# Patient Record
Sex: Male | Born: 1985 | Race: White | Hispanic: No | Marital: Single | Smoking: Current every day smoker
Health system: Southern US, Community
[De-identification: ages and names within clinical notes are randomized; demographics above are authoritative.]

---
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
## 2006-08-15 ENCOUNTER — Encounter: Admission: RE | Admit: 2006-08-15 | Discharge: 2006-08-15 | Payer: Self-pay | Admitting: Family Medicine

## 2007-05-21 ENCOUNTER — Encounter: Admission: RE | Admit: 2007-05-21 | Discharge: 2007-05-21 | Payer: Self-pay | Admitting: Family Medicine

## 2008-02-02 ENCOUNTER — Emergency Department: Payer: Self-pay | Admitting: Emergency Medicine

## 2008-06-27 ENCOUNTER — Encounter: Admission: RE | Admit: 2008-06-27 | Discharge: 2008-06-27 | Payer: Self-pay | Admitting: Family Medicine

## 2011-02-13 ENCOUNTER — Emergency Department (HOSPITAL_COMMUNITY)
Admission: EM | Admit: 2011-02-13 | Discharge: 2011-02-13 | Disposition: A | Discharge status: Home / Self Care | Payer: BC Managed Care – PPO | Source: Home / Self Care | Attending: Emergency Medicine | Admitting: Emergency Medicine

## 2011-02-13 DIAGNOSIS — Z5321 Procedure and treatment not carried out due to patient leaving prior to being seen by health care provider: Secondary | ICD-10-CM

## 2011-02-13 NOTE — ED Provider Notes (Signed)
History     CSN: [id number]  Arrival date & time 02/13/11  1246   First MD Initiated Contact with Patient 02/13/11 1303      No chief complaint on file.   (Consider location/radiation/quality/duration/timing/severity/associated sxs/prior treatment) HPI  No past medical history on file.  No past surgical history on file.  No family history on file.  History  Substance Use Topics  . Smoking status: Not on file  . Smokeless tobacco: Not on file  . Alcohol Use: Not on file      Review of Systems  Allergies  Review of patient's allergies indicates not on file.  Home Medications  No current outpatient prescriptions on file.  There were no vitals taken for this visit.  Physical Exam  ED Course  Procedures (including critical care time)  Labs Reviewed - No data to display No results found.   No diagnosis found.    MDM  The patient left the department before being triaged. I did not participate in the care of this patient.   Luiz Blare, MD 02/13/11 2134

## 2013-08-19 ENCOUNTER — Other Ambulatory Visit: Payer: Self-pay | Admitting: Family Medicine

## 2013-08-19 ENCOUNTER — Ambulatory Visit
Admission: RE | Admit: 2013-08-19 | Discharge: 2013-08-19 | Disposition: A | Discharge status: Home / Self Care | Payer: No Typology Code available for payment source | Source: Ambulatory Visit | Attending: Family Medicine | Admitting: Family Medicine

## 2013-08-19 DIAGNOSIS — G8929 Other chronic pain: Secondary | ICD-10-CM

## 2013-08-19 DIAGNOSIS — M545 Low back pain: Principal | ICD-10-CM

## 2016-07-18 ENCOUNTER — Ambulatory Visit: Payer: BLUE CROSS/BLUE SHIELD | Attending: Otolaryngology

## 2016-07-18 DIAGNOSIS — R0683 Snoring: Secondary | ICD-10-CM | POA: Insufficient documentation

## 2016-07-18 DIAGNOSIS — F5101 Primary insomnia: Secondary | ICD-10-CM | POA: Insufficient documentation

## 2016-07-18 DIAGNOSIS — G4733 Obstructive sleep apnea (adult) (pediatric): Secondary | ICD-10-CM | POA: Insufficient documentation

## 2016-08-08 ENCOUNTER — Ambulatory Visit: Payer: BLUE CROSS/BLUE SHIELD | Attending: Neurology

## 2016-08-08 DIAGNOSIS — G4733 Obstructive sleep apnea (adult) (pediatric): Secondary | ICD-10-CM | POA: Insufficient documentation

## 2016-08-08 DIAGNOSIS — F5101 Primary insomnia: Secondary | ICD-10-CM | POA: Insufficient documentation

## 2017-08-16 ENCOUNTER — Ambulatory Visit
Admission: RE | Admit: 2017-08-16 | Discharge: 2017-08-16 | Disposition: A | Discharge status: Home / Self Care | Payer: BLUE CROSS/BLUE SHIELD | Source: Ambulatory Visit | Attending: Physician Assistant | Admitting: Physician Assistant

## 2017-08-16 ENCOUNTER — Other Ambulatory Visit: Payer: Self-pay | Admitting: Physician Assistant

## 2017-08-16 DIAGNOSIS — M25531 Pain in right wrist: Secondary | ICD-10-CM | POA: Diagnosis not present

## 2017-08-16 DIAGNOSIS — M795 Residual foreign body in soft tissue: Secondary | ICD-10-CM | POA: Insufficient documentation

## 2018-09-25 DIAGNOSIS — F17200 Nicotine dependence, unspecified, uncomplicated: Secondary | ICD-10-CM | POA: Diagnosis not present

## 2018-09-25 DIAGNOSIS — H00016 Hordeolum externum left eye, unspecified eyelid: Secondary | ICD-10-CM | POA: Diagnosis not present

## 2018-11-14 ENCOUNTER — Emergency Department (HOSPITAL_COMMUNITY)
Admission: EM | Admit: 2018-11-14 | Discharge: 2018-11-14 | Disposition: A | Discharge status: Home / Self Care | Payer: BC Managed Care – PPO | Attending: Emergency Medicine | Admitting: Emergency Medicine

## 2018-11-14 ENCOUNTER — Emergency Department (HOSPITAL_COMMUNITY): Payer: BC Managed Care – PPO

## 2018-11-14 ENCOUNTER — Other Ambulatory Visit: Payer: Self-pay

## 2018-11-14 ENCOUNTER — Encounter (HOSPITAL_COMMUNITY): Payer: Self-pay | Admitting: Emergency Medicine

## 2018-11-14 DIAGNOSIS — Y92481 Parking lot as the place of occurrence of the external cause: Secondary | ICD-10-CM | POA: Insufficient documentation

## 2018-11-14 DIAGNOSIS — F17210 Nicotine dependence, cigarettes, uncomplicated: Secondary | ICD-10-CM | POA: Insufficient documentation

## 2018-11-14 DIAGNOSIS — Y93I9 Activity, other involving external motion: Secondary | ICD-10-CM | POA: Diagnosis not present

## 2018-11-14 DIAGNOSIS — Y999 Unspecified external cause status: Secondary | ICD-10-CM | POA: Diagnosis not present

## 2018-11-14 DIAGNOSIS — R52 Pain, unspecified: Secondary | ICD-10-CM | POA: Diagnosis not present

## 2018-11-14 DIAGNOSIS — I10 Essential (primary) hypertension: Secondary | ICD-10-CM | POA: Diagnosis not present

## 2018-11-14 DIAGNOSIS — M542 Cervicalgia: Secondary | ICD-10-CM | POA: Diagnosis not present

## 2018-11-14 DIAGNOSIS — R519 Headache, unspecified: Secondary | ICD-10-CM | POA: Insufficient documentation

## 2018-11-14 DIAGNOSIS — S4992XA Unspecified injury of left shoulder and upper arm, initial encounter: Secondary | ICD-10-CM | POA: Diagnosis not present

## 2018-11-14 DIAGNOSIS — S0990XA Unspecified injury of head, initial encounter: Secondary | ICD-10-CM | POA: Diagnosis not present

## 2018-11-14 DIAGNOSIS — S199XXA Unspecified injury of neck, initial encounter: Secondary | ICD-10-CM | POA: Diagnosis not present

## 2018-11-14 DIAGNOSIS — M25512 Pain in left shoulder: Secondary | ICD-10-CM | POA: Diagnosis not present

## 2018-11-14 MED ORDER — METHOCARBAMOL 500 MG PO TABS: 500.0000 mg | ORAL_TABLET | Freq: Two times a day (BID) | ORAL | 0 refills | Status: AC

## 2018-11-14 NOTE — ED Notes (Signed)
Patient transported to X-ray 
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
## 2018-11-14 NOTE — ED Notes (Signed)
Pt returned from X-ray.  
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
## 2018-11-14 NOTE — ED Notes (Signed)
Patient transported to CT 
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
## 2018-11-20 DIAGNOSIS — M542 Cervicalgia: Secondary | ICD-10-CM | POA: Diagnosis not present

## 2018-11-20 DIAGNOSIS — M25512 Pain in left shoulder: Secondary | ICD-10-CM | POA: Diagnosis not present

## 2018-12-03 DIAGNOSIS — M25512 Pain in left shoulder: Secondary | ICD-10-CM | POA: Diagnosis not present

## 2018-12-10 DIAGNOSIS — M25512 Pain in left shoulder: Secondary | ICD-10-CM | POA: Diagnosis not present

## 2018-12-21 DIAGNOSIS — S43432A Superior glenoid labrum lesion of left shoulder, initial encounter: Secondary | ICD-10-CM | POA: Diagnosis not present

## 2019-01-29 DIAGNOSIS — X58XXXA Exposure to other specified factors, initial encounter: Secondary | ICD-10-CM | POA: Diagnosis not present

## 2019-01-29 DIAGNOSIS — Y999 Unspecified external cause status: Secondary | ICD-10-CM | POA: Diagnosis not present

## 2019-01-29 DIAGNOSIS — S43432A Superior glenoid labrum lesion of left shoulder, initial encounter: Secondary | ICD-10-CM | POA: Diagnosis not present

## 2019-01-29 DIAGNOSIS — G8918 Other acute postprocedural pain: Secondary | ICD-10-CM | POA: Diagnosis not present

## 2019-01-29 DIAGNOSIS — S43492A Other sprain of left shoulder joint, initial encounter: Secondary | ICD-10-CM | POA: Diagnosis not present
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
## 2020-04-14 DIAGNOSIS — K580 Irritable bowel syndrome with diarrhea: Secondary | ICD-10-CM | POA: Diagnosis not present

## 2020-06-19 DIAGNOSIS — U071 COVID-19: Secondary | ICD-10-CM | POA: Diagnosis not present

## 2020-06-19 DIAGNOSIS — R509 Fever, unspecified: Secondary | ICD-10-CM | POA: Diagnosis not present

## 2020-06-19 DIAGNOSIS — M549 Dorsalgia, unspecified: Secondary | ICD-10-CM | POA: Diagnosis not present

## 2022-12-02 ENCOUNTER — Other Ambulatory Visit: Payer: Self-pay

## 2022-12-02 ENCOUNTER — Emergency Department: Payer: Self-pay

## 2022-12-02 ENCOUNTER — Emergency Department
Admission: EM | Admit: 2022-12-02 | Discharge: 2022-12-02 | Disposition: A | Discharge status: Home / Self Care | Payer: Self-pay | Attending: Emergency Medicine | Admitting: Emergency Medicine

## 2022-12-02 DIAGNOSIS — S61211A Laceration without foreign body of left index finger without damage to nail, initial encounter: Secondary | ICD-10-CM | POA: Insufficient documentation

## 2022-12-02 DIAGNOSIS — W228XXA Striking against or struck by other objects, initial encounter: Secondary | ICD-10-CM | POA: Insufficient documentation

## 2022-12-02 MED ORDER — LIDOCAINE HCL (PF) 1 % IJ SOLN
5.0000 mL | Freq: Once | INTRAMUSCULAR | Status: AC
  Administered 2022-12-02: 5 mL via INTRADERMAL
  Filled 2022-12-02: qty 5
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
## 2022-12-02 NOTE — Discharge Instructions (Addendum)
Your stitches will need to be removed in 7 to 10 days.  This can be done by the emergency department, urgent care or your primary care provider. You can take 650 mg of Tylenol and 600 mg of ibuprofen every 6 hours as needed for pain.  Watch for signs of infection including redness, warmth, swelling, pain and pus drainage.  If you develop any of these please return to the ED, urgent care or your primary care provider.  Please leave the dressing intact for 24 hours, after this you can remove it and wash the laceration with soap and water.  Then pat dry and redress.  Please keep covered until stitches have been removed.  If you are still unable to bend your finger after 5 days please follow-up with orthopedics as information is attached.

## 2022-12-02 NOTE — ED Provider Notes (Signed)
[facility] Provider Note    Event Date/Time   First MD Initiated Contact with Patient 12/02/22 1646     (approximate)   History   Laceration   HPI  Mark Hoffman is a 37 y.o. male with no PMH presents for evaluation of left index finger laceration.  Patient slammed his finger in a door today.  He was sent over from urgent care for further evaluation of possible tendon injury and inability to achieve anesthesia.     Physical Exam   Triage Vital Signs: ED Triage Vitals  Encounter Vitals Group     BP 12/02/22 1547 (!) 145/88     Systolic BP Percentile --      Diastolic BP Percentile --      Pulse Rate 12/02/22 1547 78     Resp 12/02/22 1547 18     Temp 12/02/22 1547 98.2 F (36.8 C)     Temp src --      SpO2 12/02/22 1547 97 %     Weight 12/02/22 1548 238 lb (108 kg)     Height 12/02/22 1548 5\' 11"  (1.803 m)     Head Circumference --      Peak Flow --      Pain Score 12/02/22 1547 5     Pain Loc --      Pain Education --      Exclude from Growth Chart --     Most recent vital signs: Vitals:   12/02/22 1547  BP: (!) 145/88  Pulse: 78  Resp: 18  Temp: 98.2 F (36.8 C)  SpO2: 97%   General: Awake, no distress.  CV:  Good peripheral perfusion. Resp:  Normal effort.  Abd:  No distention.  Other:  Approximately 2 cm laceration to the dorsal side of patient's left index finger crossing over the PIP.  Patient can fully extend finger but cannot flex finger, decreased sensation at the tip of the finger but capillary refill is intact.  Radial pulses 2+ and regular.   ED Results / Procedures / Treatments   Labs (all labs ordered are listed, but only abnormal results are displayed) Labs Reviewed - No data to display   RADIOLOGY  Left index finger x-ray obtained to evaluate for underlying fracture, interpreted the images as well as reviewed the radiologist report which was negative aside from soft tissue  edema.   PROCEDURES:  Critical Care performed: No  ..Laceration Repair  Date/Time: 12/02/2022 6:31 PM  Performed by: Cameron Ali, PA-C Authorized by: Cameron Ali, PA-C   Consent:    Consent obtained:  Verbal   Consent given by:  Patient   Risks, benefits, and alternatives were discussed: yes     Risks discussed:  Infection, pain, poor cosmetic result, tendon damage and nerve damage   Alternatives discussed:  No treatment Universal protocol:    Patient identity confirmed:  Verbally with patient Anesthesia:    Anesthesia method:  Local infiltration and nerve block   Local anesthetic:  Lidocaine 1% w/o epi   Block location:  Base of index finger   Block needle gauge:  25 G   Block anesthetic:  Lidocaine 1% w/o epi   Block technique:  Flexor tendon sheath   Block injection procedure:  Anatomic landmarks identified, introduced needle and incremental injection   Block outcome:  Incomplete block Laceration details:    Location:  Finger   Finger location:  L index finger   Length (cm):  2  Depth (mm):  3 Pre-procedure details:    Preparation:  Patient was prepped and draped in usual sterile fashion and imaging obtained to evaluate for foreign bodies Exploration:    Hemostasis achieved with:  Direct pressure and tourniquet   Imaging obtained: x-ray     Imaging outcome: foreign body not noted     Wound exploration: wound explored through full range of motion and entire depth of wound visualized   Treatment:    Area cleansed with:  Povidone-iodine   Amount of cleaning:  Standard   Irrigation solution:  Sterile saline   Irrigation method:  Syringe Skin repair:    Repair method:  Sutures   Suture size:  4-0   Suture material:  Nylon   Suture technique:  Simple interrupted   Number of sutures:  5 Approximation:    Approximation:  Close Repair type:    Repair type:  Simple Post-procedure details:    Dressing:  Splint for protection and bulky dressing    Procedure completion:  Tolerated    MEDICATIONS ORDERED IN ED: Medications  lidocaine (PF) (XYLOCAINE) 1 % injection 5 mL (5 mLs Intradermal Given by Other 12/02/22 1800)     IMPRESSION / MDM / ASSESSMENT AND PLAN / ED COURSE  I reviewed the triage vital signs and the nursing notes.                             37 year old male presents for evaluation of left index finger laceration. VSS and NAD.   Differential diagnosis includes, but is not limited to, fracture, dislocation, laceration, tendon injury, nerve injury.  Patient's presentation is most consistent with acute, uncomplicated illness.  Laceration repaired as described in the procedure note above.  Stitches can be removed in 7 to 10 days.  Patient was instructed about wound care.  He was given information for orthopedics, I advised him to follow-up with them if he is still unable to move his finger after he has the stitches removed.  He voiced understanding, all questions were answered and he was stable at discharge.    FINAL CLINICAL IMPRESSION(S) / ED DIAGNOSES   Final diagnoses:  Laceration of left index finger without foreign body without damage to nail, initial encounter     Rx / DC Orders   ED Discharge Orders     None        Note:  This document was prepared using Dragon voice recognition software and may include unintentional dictation errors.   Cameron Ali, PA-C 12/02/22 1836    Minna Antis, MD 12/02/22 1911

## 2022-12-02 NOTE — ED Triage Notes (Signed)
Pt to ED for left index finger injury, slammed in door today. Was sent from [facility] d/t concern for tendon injury and states they tried to numb him to stitch him and were unable to get numbing effect. Bleeding controlled, wrapped at this time.  Reports was unable to bend his finger.
